# Patient Record
Sex: Male | Born: 1985 | Race: Black or African American | Hispanic: No | Marital: Single | Smoking: Current every day smoker
Health system: Southern US, Community
[De-identification: ages and names within clinical notes are randomized; demographics above are authoritative.]

---

## 2013-06-24 ENCOUNTER — Emergency Department (HOSPITAL_COMMUNITY)
Admission: EM | Admit: 2013-06-24 | Discharge: 2013-06-24 | Disposition: A | Discharge status: Home / Self Care | Payer: Self-pay | Attending: Emergency Medicine | Admitting: Emergency Medicine

## 2013-06-24 ENCOUNTER — Emergency Department (HOSPITAL_COMMUNITY): Payer: Self-pay

## 2013-06-24 ENCOUNTER — Encounter (HOSPITAL_COMMUNITY): Payer: Self-pay | Admitting: Emergency Medicine

## 2013-06-24 DIAGNOSIS — F17200 Nicotine dependence, unspecified, uncomplicated: Secondary | ICD-10-CM | POA: Insufficient documentation

## 2013-06-24 DIAGNOSIS — J029 Acute pharyngitis, unspecified: Secondary | ICD-10-CM | POA: Insufficient documentation

## 2013-06-24 DIAGNOSIS — B9789 Other viral agents as the cause of diseases classified elsewhere: Secondary | ICD-10-CM | POA: Insufficient documentation

## 2013-06-24 DIAGNOSIS — J3489 Other specified disorders of nose and nasal sinuses: Secondary | ICD-10-CM | POA: Insufficient documentation

## 2013-06-24 DIAGNOSIS — B349 Viral infection, unspecified: Secondary | ICD-10-CM

## 2013-06-24 LAB — RAPID STREP SCREEN (MED CTR MEBANE ONLY): STREPTOCOCCUS, GROUP A SCREEN (DIRECT): NEGATIVE

## 2013-06-24 MED ORDER — GUAIFENESIN 100 MG/5ML PO LIQD: 100.0000 mg | ORAL | Status: DC | PRN

## 2013-06-24 MED ORDER — ACETAMINOPHEN 325 MG PO TABS
650.0000 mg | ORAL_TABLET | Freq: Four times a day (QID) | ORAL | Status: DC | PRN
  Administered 2013-06-24: 650 mg via ORAL
  Filled 2013-06-24: qty 2

## 2013-06-24 MED ORDER — ALBUTEROL SULFATE HFA 108 (90 BASE) MCG/ACT IN AERS
2.0000 | INHALATION_SPRAY | Freq: Four times a day (QID) | RESPIRATORY_TRACT | Status: DC | PRN
  Administered 2013-06-24: 2 via RESPIRATORY_TRACT
  Filled 2013-06-24: qty 6.7

## 2013-06-24 MED ORDER — CYCLOBENZAPRINE HCL 10 MG PO TABS: 10.0000 mg | ORAL_TABLET | Freq: Two times a day (BID) | ORAL | Status: DC | PRN

## 2013-06-24 NOTE — ED Provider Notes (Signed)
CSN: [id number]     Arrival date & time 06/24/13  1507 History  This chart was scribed for non-physician practitioner, Fuller Canada, working with Hurman Horn, MD by Smiley Houseman, ED Scribe. This patient was seen in room TR05C/TR05C and the patient's care was started at 6:20 PM.  Chief Complaint  Patient presents with  . Fever  . Sore Throat  . Nasal Congestion    The history is provided by the patient. No language interpreter was used.   HPI Comments: Brandon Reese is a 28 y.o. male who presents to the Emergency Department complaining of constant worsening nasal congestion with associated sore throat and non productive cough that started about 3 days ago.  Pt states his congestion is productive of bloody mucous.  He reports he has had a subjective fever and chills.  ED temperature is currently 102.53F.  Pt reports he took a [facility] yesterday, which provided relief for about 2 hours.  He denies any long travels or air plane travels.  He also denies any h/o of blood clots.  Pt reports he smokes cigarettes.  He states he last surgery was about 7 months ago on his jaw.    History reviewed. No pertinent past medical history. History reviewed. No pertinent past surgical history. No family history on file. History  Substance Use Topics  . Smoking status: Current Every Day Smoker  . Smokeless tobacco: Not on file  . Alcohol Use: Yes     Comment: occ    Review of Systems  Constitutional: Positive for fever and chills.  HENT: Positive for congestion and sore throat. Negative for ear pain.   Respiratory: Positive for cough.   Cardiovascular: Negative for chest pain.  Gastrointestinal: Negative for nausea, vomiting, diarrhea and anal bleeding.  Psychiatric/Behavioral: Negative for behavioral problems and confusion.  All other systems reviewed and are negative.    Allergies  Review of patient's allergies indicates no known allergies.  Home Medications  No current outpatient  prescriptions on file.  Triage Vitals: BP 129/78  Pulse 90  Temp(Src) 102.3 F (39.1 C) (Oral)  Resp 20  Ht 5\' 7"  (1.702 m)  Wt 131 lb 8 oz (59.648 kg)  BMI 20.59 kg/m2  SpO2 96%  Physical Exam  Nursing note and vitals reviewed. Constitutional: He is oriented to person, place, and time. He appears well-developed and well-nourished. No distress.  HENT:  Head: Normocephalic and atraumatic.  Oropharynx mildly erythematous.  No abscess.  Airways intact.    Eyes: EOM are normal.  Neck: Neck supple. No tracheal deviation present.  No meningismus, patient is able to move the neck through all ranges of motion without difficulty  Cardiovascular: Normal rate, regular rhythm and normal heart sounds.  Exam reveals no gallop and no friction rub.   No murmur heard. Pulmonary/Chest: Effort normal and breath sounds normal. No respiratory distress. He has no wheezes. He has no rales. He exhibits no tenderness.  Abdominal: Soft. He exhibits no distension.  Musculoskeletal: Normal range of motion.  Upper trapezius is tender to palpation  Neurological: He is alert and oriented to person, place, and time.  Skin: Skin is warm and dry. No rash noted.  Psychiatric: He has a normal mood and affect. His behavior is normal. Judgment and thought content normal.    ED Course  Procedures (including critical care time) DIAGNOSTIC STUDIES: Oxygen Saturation is 96% on RA, adequate by my interpretation.    COORDINATION OF CARE: 6:25 PM-Discussed with pt his strep test was negative.  Will order chest x-ray.  Will order Tylenol.  Patient informed of current plan of treatment and evaluation and agrees with plan.   Results for orders placed during the hospital encounter of 06/24/13  RAPID STREP SCREEN      Result Value Ref Range   Streptococcus, Group A Screen (Direct) NEGATIVE  NEGATIVE   Dg Chest 2 View  06/24/2013   CLINICAL DATA:  Fever.  Cough.  Chest congestion and body aches.  EXAM: CHEST  2 VIEW   COMPARISON:  None.  FINDINGS: The heart size and mediastinal contours are within normal limits. Both lungs are clear. The visualized skeletal structures are unremarkable.  IMPRESSION: No active cardiopulmonary disease.   Electronically Signed   By: Myles RosenthalJohn  Stahl M.D.   On: 06/24/2013 19:33      MDM   Final diagnoses:  Viral syndrome   Patient with viral syndrome. Afebrile, but improved with Tylenol. Chest x-ray and strep tests are negative. At discharge, the patient asked for something for his neck stiffness. No headaches, no meningeal signs, no evidence of meningitis. I discussed patient with Dr. Lestine BoxBednarz, and agrees with the patient can be discharged to home. Patient given strict return precautions. Patient is stable and ready for discharge.  I personally performed the services described in this documentation, which was scribed in my presence. The recorded information has been reviewed and is accurate.     Roxy Horsemanobert Jyron Turman, PA-C 06/24/13 2008

## 2013-06-24 NOTE — Discharge Instructions (Signed)
Viral Infections °A virus is a type of germ. Viruses can cause: °· Minor sore throats. °· Aches and pains. °· Headaches. °· Runny nose. °· Rashes. °· Watery eyes. °· Tiredness. °· Coughs. °· Loss of appetite. °· Feeling sick to your stomach (nausea). °· Throwing up (vomiting). °· Watery poop (diarrhea). °HOME CARE  °· Only take medicines as told by your doctor. °· Drink enough water and fluids to keep your pee (urine) clear or pale yellow. Sports drinks are a good choice. °· Get plenty of rest and eat healthy. Soups and broths with crackers or rice are fine. °GET HELP RIGHT AWAY IF:  °· You have a very bad headache. °· You have shortness of breath. °· You have chest pain or neck pain. °· You have an unusual rash. °· You cannot stop throwing up. °· You have watery poop that does not stop. °· You cannot keep fluids down. °· You or your child has a temperature by mouth above 102° F (38.9° C), not controlled by medicine. °· Your baby is older than 3 months with a rectal temperature of 102° F (38.9° C) or higher. °· Your baby is 3 months old or younger with a rectal temperature of 100.4° F (38° C) or higher. °MAKE SURE YOU:  °· Understand these instructions. °· Will watch this condition. °· Will get help right away if you are not doing well or get worse. °Document Released: 04/02/2008 Document Revised: 07/13/2011 Document Reviewed: 08/26/2010 °Exit[employer]® Patient Information ©2014 [employer]. ° °

## 2013-06-24 NOTE — ED Notes (Signed)
Pt here with fever, sore throat, and congestion. Pt last had BC powder at 0400 today.

## 2013-06-25 NOTE — ED Provider Notes (Signed)
Medical screening examination/treatment/procedure(s) were performed by non-physician practitioner and as supervising physician I was immediately available for consultation/collaboration.   Giles Currie M Noha Karasik, MD 06/25/13 0256 

## 2013-06-27 LAB — CULTURE, GROUP A STREP

## 2014-10-25 IMAGING — CR DG CHEST 2V
2 series · 2 of 2 positions shown · non-contrast
Comparison: None.

CLINICAL DATA: Fever.  Cough.  Chest congestion and body aches.

EXAM:
CHEST  2 VIEW

[w chest pa]
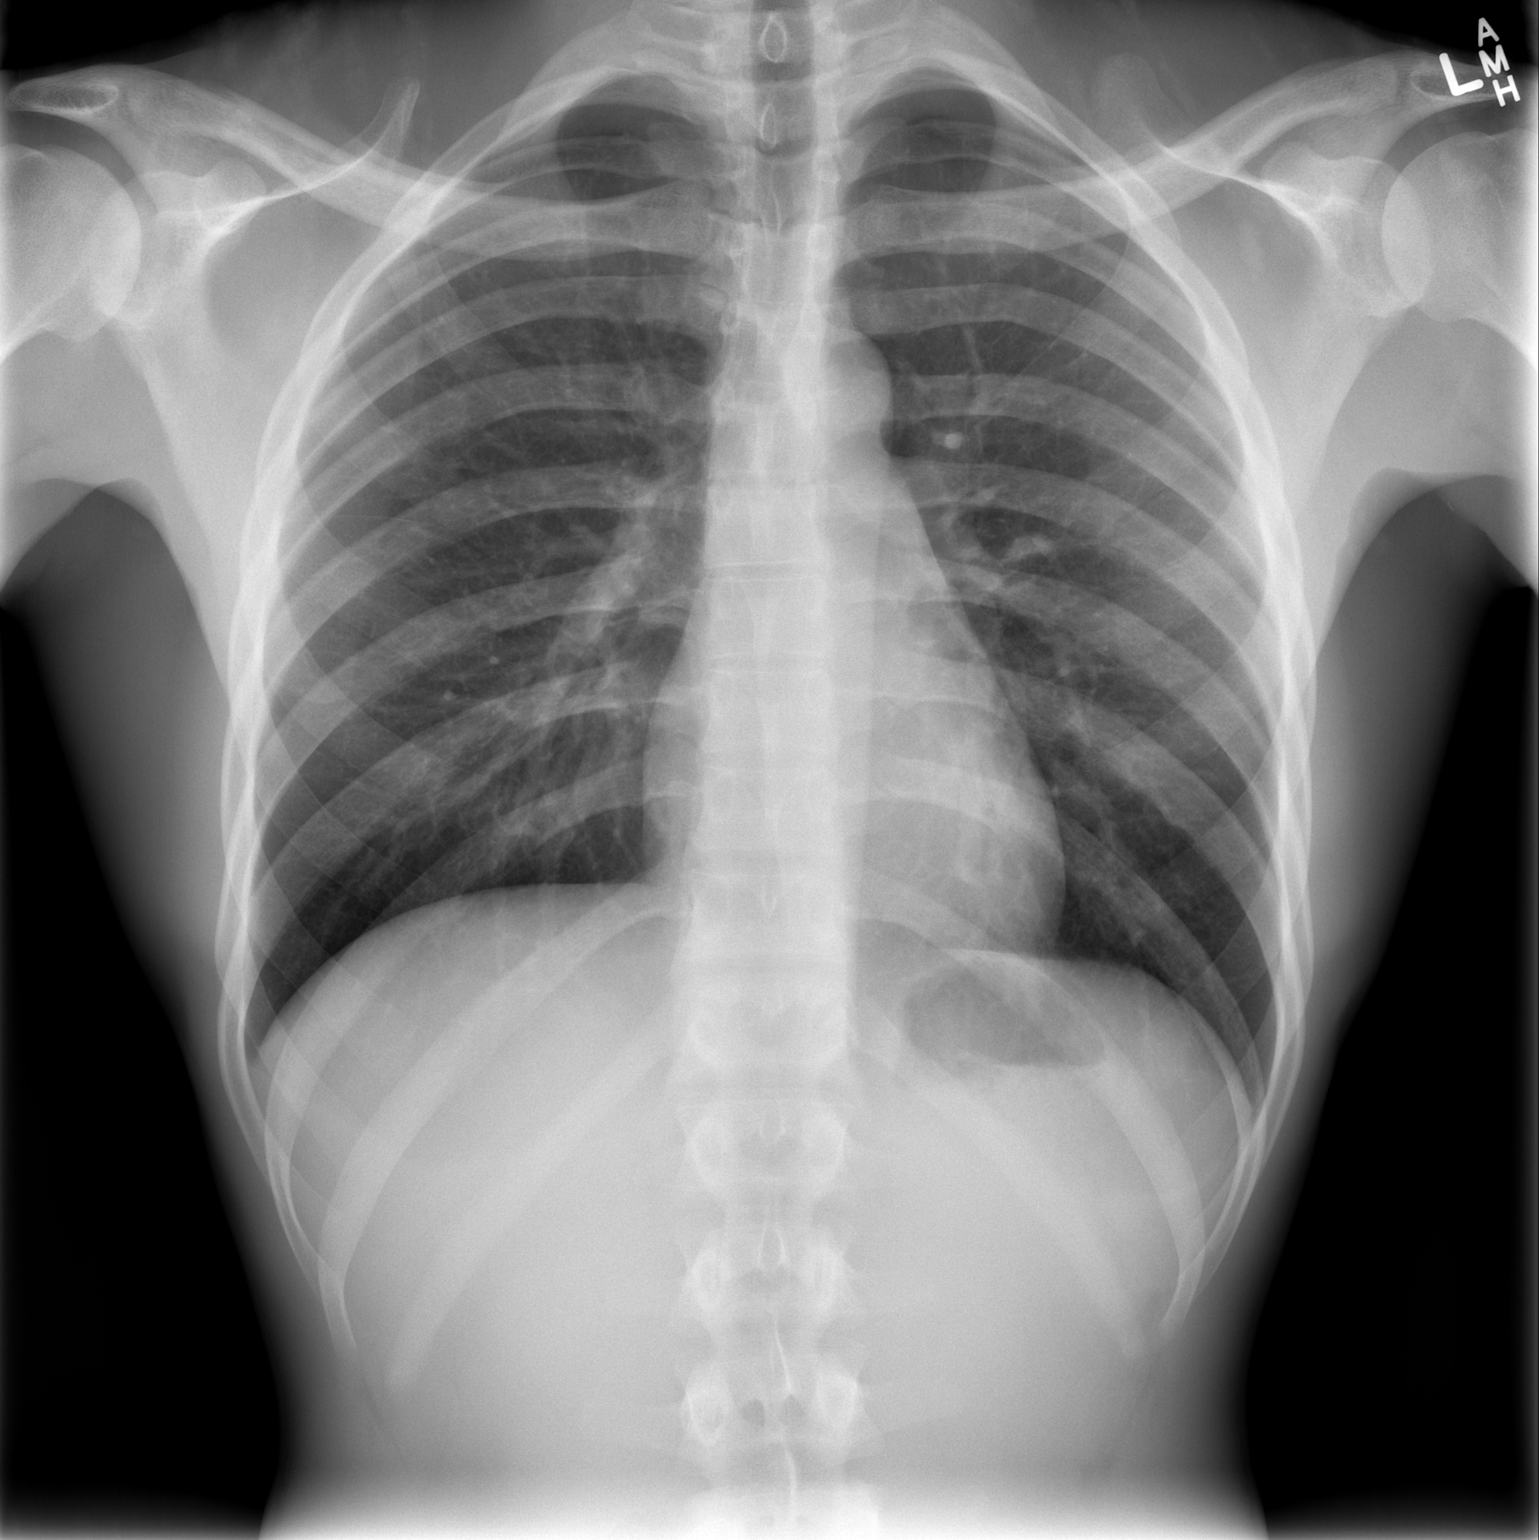

[w chest lat]
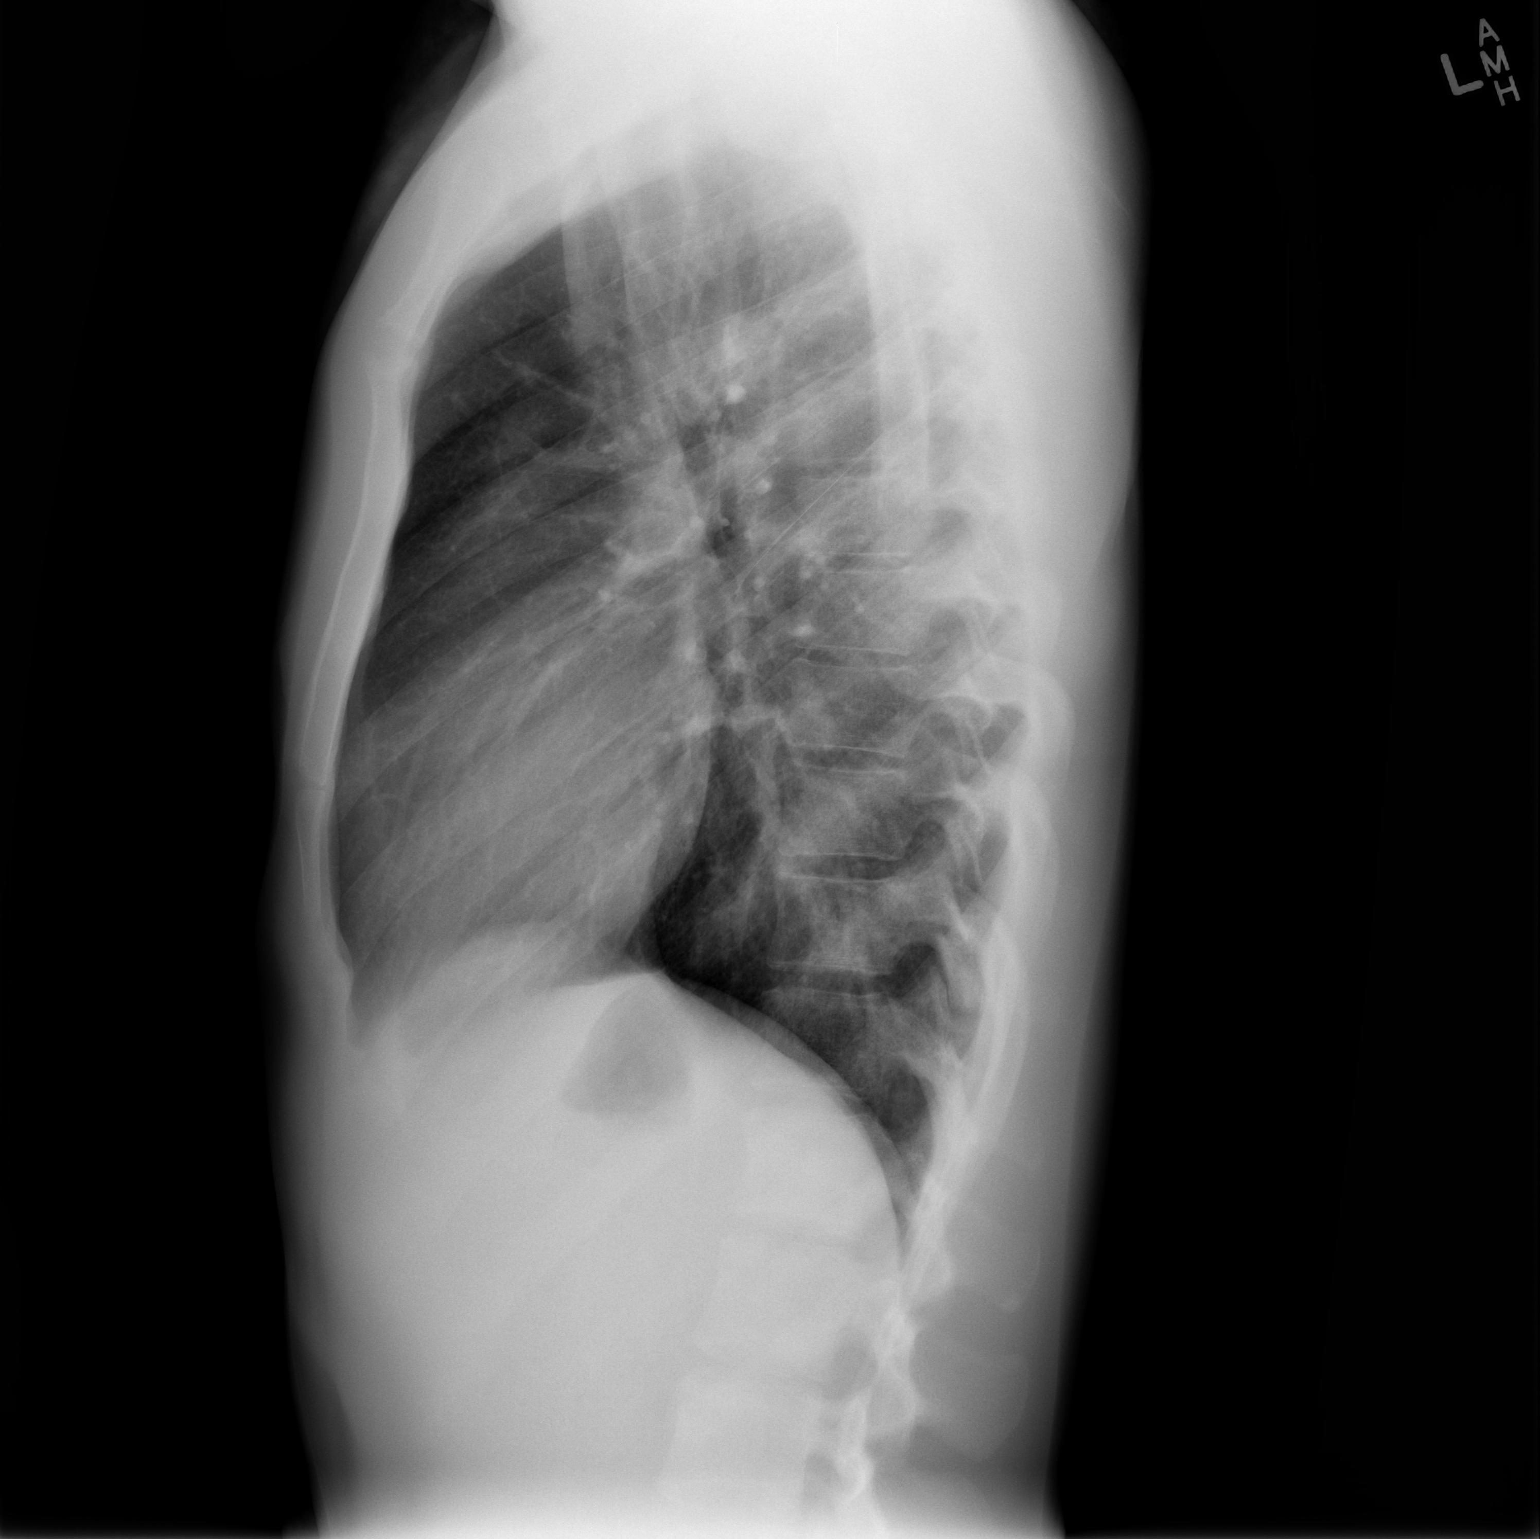

[2 of 2 positions shown; findings below may reference images not displayed]

FINDINGS: The heart size and mediastinal contours are within normal limits.
Both lungs are clear. The visualized skeletal structures are
unremarkable.
IMPRESSION: No active cardiopulmonary disease.

## 2015-06-01 ENCOUNTER — Encounter (HOSPITAL_COMMUNITY): Payer: Self-pay | Admitting: Nurse Practitioner

## 2015-06-01 ENCOUNTER — Emergency Department (HOSPITAL_COMMUNITY)
Admission: EM | Admit: 2015-06-01 | Discharge: 2015-06-01 | Disposition: A | Discharge status: Home / Self Care | Payer: Self-pay | Attending: Emergency Medicine | Admitting: Emergency Medicine

## 2015-06-01 DIAGNOSIS — F10120 Alcohol abuse with intoxication, uncomplicated: Secondary | ICD-10-CM

## 2015-06-01 DIAGNOSIS — F17200 Nicotine dependence, unspecified, uncomplicated: Secondary | ICD-10-CM | POA: Insufficient documentation

## 2015-06-01 DIAGNOSIS — F1010 Alcohol abuse, uncomplicated: Secondary | ICD-10-CM | POA: Insufficient documentation

## 2015-06-01 LAB — CBC WITH DIFFERENTIAL/PLATELET
Basophils Absolute: 0 10*3/uL (ref 0.0–0.1)
Basophils Relative: 0 %
Eosinophils Absolute: 0 10*3/uL (ref 0.0–0.7)
Eosinophils Relative: 0 %
HCT: 43.9 % (ref 39.0–52.0)
Hemoglobin: 15 g/dL (ref 13.0–17.0)
Lymphocytes Relative: 10 %
Lymphs Abs: 1.1 10*3/uL (ref 0.7–4.0)
MCH: 33.6 pg (ref 26.0–34.0)
MCHC: 34.2 g/dL (ref 30.0–36.0)
MCV: 98.4 fL (ref 78.0–100.0)
Monocytes Absolute: 0.7 10*3/uL (ref 0.1–1.0)
Monocytes Relative: 7 %
Neutro Abs: 8.4 10*3/uL — ABNORMAL HIGH (ref 1.7–7.7)
Neutrophils Relative %: 83 %
Platelets: 187 10*3/uL (ref 150–400)
RBC: 4.46 MIL/uL (ref 4.22–5.81)
RDW: 11.5 % (ref 11.5–15.5)
WBC: 10.2 10*3/uL (ref 4.0–10.5)

## 2015-06-01 LAB — COMPREHENSIVE METABOLIC PANEL
ALT: 28 U/L (ref 17–63)
AST: 40 U/L (ref 15–41)
Albumin: 5.1 g/dL — ABNORMAL HIGH (ref 3.5–5.0)
Alkaline Phosphatase: 91 U/L (ref 38–126)
Anion gap: 15 (ref 5–15)
BUN: 16 mg/dL (ref 6–20)
CO2: 23 mmol/L (ref 22–32)
Calcium: 9.9 mg/dL (ref 8.9–10.3)
Chloride: 97 mmol/L — ABNORMAL LOW (ref 101–111)
Creatinine, Ser: 1.12 mg/dL (ref 0.61–1.24)
GFR calc Af Amer: >60 mL/min (ref >60)
GFR calc non Af Amer: >60 mL/min (ref >60)
Glucose, Bld: 283 mg/dL — ABNORMAL HIGH (ref 65–99)
Potassium: 4 mmol/L (ref 3.5–5.1)
Sodium: 135 mmol/L (ref 135–145)
Total Bilirubin: 1.8 mg/dL — ABNORMAL HIGH (ref 0.3–1.2)
Total Protein: 8.9 g/dL — ABNORMAL HIGH (ref 6.5–8.1)

## 2015-06-01 MED ORDER — ONDANSETRON HCL 4 MG/2ML IJ SOLN
4.0000 mg | Freq: Once | INTRAMUSCULAR | Status: AC
  Administered 2015-06-01: 4 mg via INTRAVENOUS
  Filled 2015-06-01: qty 2

## 2015-06-01 MED ORDER — SUCRALFATE 1 GM/10ML PO SUSP
1.0000 g | Freq: Once | ORAL | Status: AC
  Administered 2015-06-01: 1 g via ORAL
  Filled 2015-06-01: qty 10

## 2015-06-01 MED ORDER — SODIUM CHLORIDE 0.9 % IV BOLUS (SEPSIS)
2000.0000 mL | Freq: Once | INTRAVENOUS | Status: AC
  Administered 2015-06-01: 2000 mL via INTRAVENOUS

## 2015-06-01 MED ORDER — PROMETHAZINE HCL 25 MG PO TABS: 25.0000 mg | ORAL_TABLET | Freq: Three times a day (TID) | ORAL | Status: DC | PRN

## 2015-06-01 MED ORDER — PROMETHAZINE HCL 25 MG/ML IJ SOLN
25.0000 mg | Freq: Once | INTRAMUSCULAR | Status: DC
  Filled 2015-06-01: qty 1

## 2015-06-01 MED ORDER — PANTOPRAZOLE SODIUM 20 MG PO TBEC
20.0000 mg | DELAYED_RELEASE_TABLET | Freq: Every day | ORAL | Status: DC
  Administered 2015-06-01: 20 mg via ORAL
  Filled 2015-06-01: qty 1

## 2015-06-01 NOTE — ED Notes (Signed)
Pt states "I feel a lot better.  I think I just need to eat."

## 2015-06-01 NOTE — Discharge Instructions (Signed)
Return here as needed.  Follow-up with a primary care doctor.  Your testing here today was normal other than your blood sugar being elevated.  This will need to be rechecked

## 2015-06-01 NOTE — ED Notes (Signed)
Pt states last night he drank excessively, 1/2 a bottle of 40oz 100 proof liquor, woke with severe muscle weakness, cramping vomiting and "feeling sick." Reports throat burning sensation. Denies co-ingestions, denies being an alcoholic.

## 2015-06-03 NOTE — ED Provider Notes (Signed)
CSN: [id number]     Arrival date & time 06/01/15  1946 History   First MD Initiated Contact with Patient 06/01/15 2010     Chief Complaint  Patient presents with  . Emesis  . Fatigue     (Consider location/radiation/quality/duration/timing/severity/associated sxs/prior Treatment) HPI Patient presents to the emergency department with nausea, vomiting after drinking heavy amounts of alcohol last night.  The patient states that he went out drinking and had half a bottle of liquor and states she took a couple shots. Feels dizzy with cramping and vomiting.  The patient states that he has acid reflux as well.  Patient did not take any medications prior to arrival.  Patient denies chest pain, shortness of breath, weakness, numbness, dizziness, back pain, neck pain, fever, dysuria, incontinence, but stool, hematemesis, diarrhea, or syncope History reviewed. No pertinent past medical history. History reviewed. No pertinent past surgical history. History reviewed. No pertinent family history. Social History  Substance Use Topics  . Smoking status: Current Every Day Smoker  . Smokeless tobacco: None  . Alcohol Use: Yes     Comment: occ    Review of Systems  All other systems negative except as documented in the HPI. All pertinent positives and negatives as reviewed in the HPI.  Allergies  Review of patient's allergies indicates no known allergies.  Home Medications   Prior to Admission medications   Medication Sig Start Date End Date Taking? Authorizing Provider  cyclobenzaprine (FLEXERIL) 10 MG tablet Take 1 tablet (10 mg total) by mouth 2 (two) times daily as needed for muscle spasms. Patient not taking: Reported on 06/01/2015 06/24/13   Roxy Horseman, PA-C  guaiFENesin (ROBITUSSIN) 100 MG/5ML liquid Take 5-10 mLs (100-200 mg total) by mouth every 4 (four) hours as needed for cough. Patient not taking: Reported on 06/01/2015 06/24/13   Roxy Horseman, PA-C  promethazine (PHENERGAN) 25  MG tablet Take 1 tablet (25 mg total) by mouth every 8 (eight) hours as needed for nausea or vomiting. 06/01/15   Charlestine Night, PA-C   BP 133/87 mmHg  Pulse 90  Temp(Src) 98.2 F (36.8 C) (Oral)  Resp 18  SpO2 99% Physical Exam  Constitutional: He is oriented to person, place, and time. He appears well-developed and well-nourished. No distress.  HENT:  Head: Normocephalic and atraumatic.  Mouth/Throat: Oropharynx is clear and moist.  Eyes: Pupils are equal, round, and reactive to light.  Neck: Normal range of motion. Neck supple.  Cardiovascular: Normal rate, regular rhythm and normal heart sounds.  Exam reveals no gallop and no friction rub.   No murmur heard. Pulmonary/Chest: Effort normal and breath sounds normal. No respiratory distress. He has no wheezes.  Abdominal: Soft. Bowel sounds are normal. He exhibits no distension. There is no tenderness.  Neurological: He is alert and oriented to person, place, and time. He exhibits normal muscle tone. Coordination normal.  Skin: Skin is warm and dry. No rash noted. No erythema.  Psychiatric: He has a normal mood and affect. His behavior is normal.  Nursing note and vitals reviewed.   ED Course  Procedures (including critical care time) Labs Review Labs Reviewed  COMPREHENSIVE METABOLIC PANEL - Abnormal; Notable for the following:    Chloride 97 (*)    Glucose, Bld 283 (*)    Total Protein 8.9 (*)    Albumin 5.1 (*)    Total Bilirubin 1.8 (*)    All other components within normal limits  CBC WITH DIFFERENTIAL/PLATELET - Abnormal; Notable for the following:  Neutro Abs 8.4 (*)    All other components within normal limits    Imaging Review No results found. I have personally reviewed and evaluated these images and lab results as part of my medical decision-making.   EKG Interpretation None      MDM   Final diagnoses:  Hangover effect, uncomplicated (HCC)    Patient will be given IV fluids, antiemetics.  Told  to return here as needed.  Patient agrees the plan and all questions were answered.  Patient is feeling better following IV fluids and antiemetics    Charlestine Night, PA-C 06/03/15 0106  Richardean Canal, MD 06/04/15 1122

## 2019-11-22 ENCOUNTER — Other Ambulatory Visit: Payer: Self-pay | Admitting: *Deleted

## 2019-11-22 ENCOUNTER — Other Ambulatory Visit: Payer: Self-pay

## 2019-11-22 ENCOUNTER — Ambulatory Visit: Payer: Self-pay | Attending: Internal Medicine

## 2019-11-22 DIAGNOSIS — Z20822 Contact with and (suspected) exposure to covid-19: Secondary | ICD-10-CM

## 2019-11-23 LAB — SARS-COV-2, NAA 2 DAY TAT

## 2019-11-23 LAB — NOVEL CORONAVIRUS, NAA: SARS-CoV-2, NAA: NOT DETECTED

## 2019-12-19 ENCOUNTER — Other Ambulatory Visit: Payer: Self-pay

## 2019-12-19 DIAGNOSIS — Z20822 Contact with and (suspected) exposure to covid-19: Secondary | ICD-10-CM

## 2019-12-20 LAB — SARS-COV-2, NAA 2 DAY TAT

## 2019-12-20 LAB — NOVEL CORONAVIRUS, NAA: SARS-CoV-2, NAA: NOT DETECTED

## 2020-01-23 ENCOUNTER — Other Ambulatory Visit: Payer: Self-pay

## 2020-01-23 DIAGNOSIS — Z20822 Contact with and (suspected) exposure to covid-19: Secondary | ICD-10-CM

## 2020-01-25 LAB — NOVEL CORONAVIRUS, NAA: SARS-CoV-2, NAA: NOT DETECTED

## 2020-01-25 LAB — SARS-COV-2, NAA 2 DAY TAT

## 2022-07-06 ENCOUNTER — Emergency Department (HOSPITAL_COMMUNITY): Payer: Self-pay

## 2022-07-06 ENCOUNTER — Emergency Department (HOSPITAL_COMMUNITY)
Admission: EM | Admit: 2022-07-06 | Discharge: 2022-07-06 | Disposition: A | Discharge status: Home / Self Care | Payer: Self-pay | Attending: Emergency Medicine | Admitting: Emergency Medicine

## 2022-07-06 ENCOUNTER — Encounter (HOSPITAL_COMMUNITY): Payer: Self-pay | Admitting: Emergency Medicine

## 2022-07-06 DIAGNOSIS — J020 Streptococcal pharyngitis: Secondary | ICD-10-CM | POA: Insufficient documentation

## 2022-07-06 DIAGNOSIS — Z1152 Encounter for screening for COVID-19: Secondary | ICD-10-CM | POA: Insufficient documentation

## 2022-07-06 LAB — RESP PANEL BY RT-PCR (RSV, FLU A&B, COVID)  RVPGX2
Influenza A by PCR: NEGATIVE
Influenza B by PCR: NEGATIVE
Resp Syncytial Virus by PCR: NEGATIVE
SARS Coronavirus 2 by RT PCR: NEGATIVE

## 2022-07-06 LAB — GROUP A STREP BY PCR: Group A Strep by PCR: DETECTED — AB

## 2022-07-06 MED ORDER — METOCLOPRAMIDE HCL 5 MG/ML IJ SOLN
10.0000 mg | Freq: Once | INTRAMUSCULAR | Status: AC
  Administered 2022-07-06: 10 mg via INTRAMUSCULAR
  Filled 2022-07-06: qty 2

## 2022-07-06 MED ORDER — KETOROLAC TROMETHAMINE 15 MG/ML IJ SOLN
15.0000 mg | Freq: Once | INTRAMUSCULAR | Status: AC
  Administered 2022-07-06: 15 mg via INTRAMUSCULAR
  Filled 2022-07-06: qty 1

## 2022-07-06 MED ORDER — DEXAMETHASONE SODIUM PHOSPHATE 10 MG/ML IJ SOLN
10.0000 mg | Freq: Once | INTRAMUSCULAR | Status: AC
  Administered 2022-07-06: 10 mg via INTRAMUSCULAR
  Filled 2022-07-06: qty 1

## 2022-07-06 MED ORDER — PENICILLIN V POTASSIUM 500 MG PO TABS: 500.0000 mg | ORAL_TABLET | Freq: Two times a day (BID) | ORAL | 0 refills | Status: AC

## 2022-07-06 NOTE — ED Provider Notes (Signed)
[facility] Provider Note   CSN: [id number] Arrival date & time: 07/06/22  0920     History  Chief Complaint  Patient presents with   Sore Throat    Brandon Reese is a 37 y.o. male with no past medical history presents to the ED complaining of sore throat, subjective fever, chills, and generalized headache for the past 2 weeks.  Patient reports positive sick contacts but believes may have influenza.  He states that at onset of symptoms he had associated cough, congestion, and bodyaches but these have since resolved, however, he has had persistent sore throat and headache that will not go away with use of over-the-counter medications.  He denies chest pain, shortness of breath, difficulty swallowing, abdominal pain, nausea, vomiting, or diarrhea.      Home Medications Prior to Admission medications   Medication Sig Start Date End Date Taking? Authorizing Provider  penicillin v potassium (VEETID) 500 MG tablet Take 1 tablet (500 mg total) by mouth in the morning and at bedtime for 10 days. 07/06/22 07/16/22 Yes Darshana Curnutt L, PA-C  cyclobenzaprine (FLEXERIL) 10 MG tablet Take 1 tablet (10 mg total) by mouth 2 (two) times daily as needed for muscle spasms. Patient not taking: Reported on 06/01/2015 06/24/13   Montine Circle, PA-C  guaiFENesin (ROBITUSSIN) 100 MG/5ML liquid Take 5-10 mLs (100-200 mg total) by mouth every 4 (four) hours as needed for cough. Patient not taking: Reported on 06/01/2015 06/24/13   Montine Circle, PA-C  promethazine (PHENERGAN) 25 MG tablet Take 1 tablet (25 mg total) by mouth every 8 (eight) hours as needed for nausea or vomiting. 06/01/15   Dalia Heading, PA-C      Allergies    Patient has no known allergies.    Review of Systems   Review of Systems  All other systems reviewed and are negative.   Physical Exam Updated Vital Signs BP (!) 136/94   Pulse 94   Temp 100.1 F (37.8 C)   Resp 18    SpO2 97%  Physical Exam Vitals and nursing note reviewed.  Constitutional:      General: He is not in acute distress.    Appearance: Normal appearance. He is not ill-appearing, toxic-appearing or diaphoretic.  HENT:     Head: Normocephalic and atraumatic.     Nose: No congestion or rhinorrhea.     Mouth/Throat:     Mouth: Mucous membranes are moist. No oral lesions.     Pharynx: Uvula midline. Pharyngeal swelling (minimal to right posterior pharynx) and posterior oropharyngeal erythema (moderate to posterior pharynx) present. No uvula swelling.     Tonsils: Tonsillar exudate (white to bilateral tonsils worse on right) present. No tonsillar abscesses. 1+ on the right. 1+ on the left.     Comments: No submental, submandibular, or sublingual tenderness, induration, or fluctuance Eyes:     Conjunctiva/sclera: Conjunctivae normal.  Cardiovascular:     Rate and Rhythm: Normal rate and regular rhythm.     Heart sounds: No murmur heard. Pulmonary:     Effort: Pulmonary effort is normal. No respiratory distress.     Breath sounds: Normal breath sounds. No stridor. No wheezing, rhonchi or rales.  Chest:     Chest wall: No tenderness.  Abdominal:     General: Abdomen is flat.     Palpations: Abdomen is soft.     Tenderness: There is no abdominal tenderness. There is no guarding or rebound.  Musculoskeletal:  General: Normal range of motion.     Cervical back: Normal range of motion and neck supple.     Right lower leg: No edema.     Left lower leg: No edema.  Lymphadenopathy:     Cervical: Cervical adenopathy (mildly swollen and tender right anterior cervical nodes) present.  Skin:    General: Skin is warm and dry.     Capillary Refill: Capillary refill takes less than 2 seconds.  Neurological:     General: No focal deficit present.     Mental Status: He is alert. Mental status is at baseline.  Psychiatric:        Mood and Affect: Mood normal.        Behavior: Behavior normal.      ED Results / Procedures / Treatments   Labs (all labs ordered are listed, but only abnormal results are displayed) Labs Reviewed  GROUP A STREP BY PCR - Abnormal; Notable for the following components:      Result Value   Group A Strep by PCR DETECTED (*)    All other components within normal limits  RESP PANEL BY RT-PCR (RSV, FLU A&B, COVID)  [id number]    EKG None  Radiology No results found.  Procedures Procedures    Medications Ordered in ED Medications  metoCLOPramide (REGLAN) injection 10 mg (10 mg Intramuscular Given 07/06/22 1100)  ketorolac (TORADOL) 15 MG/ML injection 15 mg (15 mg Intramuscular Given 07/06/22 1103)  dexamethasone (DECADRON) injection 10 mg (10 mg Intramuscular Given 07/06/22 1102)    ED Course/ Medical Decision Making/ A&P                             Medical Decision Making Amount and/or Complexity of Data Reviewed Labs: ordered. Decision-making details documented in ED Course.  Risk Prescription drug management.   Medical Decision Making:   Brandon Reese is a 37 y.o. male who presented to the ED today with sore throat detailed above.    Patient's presentation is complicated by their history of recent viral illness.  Complete initial physical exam performed, notably the patient was in no acute distress, lungs are clear to auscultation, patient did have mild posterior pharyngeal swelling but airway was widely patent.  Posterior pharynx was erythematous and there was exudate to bilateral tonsils worse on the right.  Patient neurologically intact.  No meningismus. Reviewed and confirmed nursing documentation for past medical history, family history, social history.    Initial Assessment:   With the patient's presentation of sore throat, most likely diagnosis is strep pharyngitis. Differential diagnosis includes but is not limited to viral pharyngitis, COVID-19, influenza, RSV, pneumonia, meningitis, Ludwigs angina.  This is most consistent with  an acute complicated illness  Initial Plan:  Strep swab Viral swabs Headache treatment Objective evaluation as reviewed   Initial Study Results:    Final Assessment and Plan:   This is a 37 year old male presenting to the ED complaining of sore throat and headache.  Patient with other recent upper respiratory symptoms that have mostly resolved.  Positive sick contacts with influenza.  On exam, patient has pharyngeal erythema and mild swelling on the right.  Tonsils are also erythematous with white exudates.  Airway is patent.  Lungs are clear to auscultation.  No signs of respiratory distress.  No signs of peritonsillar or retropharyngeal abscess. No nuchal rigidity or signs of meningismus.  Viral and strep swabs ordered as above.  Patient with stable  vital signs though slightly elevated temperature at 100.1 F.  Normal oxygen saturation on room air, no tachypnea, no tachycardia.  Strep swab was positive.  Patient reports good headache relief with medications as given above.  Discussed option with patient for strep treatment with IM antibiotics today or p.o. antibiotics for home.  Patient reports he would like to proceed with the p.o. antibiotic course.  Prescription provided for antibiotics.  Patient given strict ED return precautions, all questions answered, patient stable for discharge.   Clinical Impression:  1. Strep pharyngitis      Discharge         Final Clinical Impression(s) / ED Diagnoses Final diagnoses:  Strep pharyngitis    Rx / DC Orders ED Discharge Orders          Ordered    penicillin v potassium (VEETID) 500 MG tablet  2 times daily        07/06/22 1319              Suzzette Righter, PA-C 07/06/22 1649    Pattricia Boss, MD 07/08/22 1450

## 2022-07-06 NOTE — ED Triage Notes (Signed)
Pt reports flu like symptoms for week that went away and sore throat and headache that is keeping him up at night. Concerned he has strep.

## 2022-07-06 NOTE — Discharge Instructions (Addendum)
Thank you for letting us take care of you today.  Your strep test was positive.  We gave you medications while in the emergency department to treat your symptoms.  I am sending you home with antibiotics to treat strep which is a bacterial infection.  Please take all of these antibiotics as prescribed.  I recommend that you follow-up with primary care in the next week if you continue to have symptoms.  If you do not have a primary care provider, I provided the names of 2 clinics that you may contact to arrange a follow-up appointment or you may go to a PCP of your own choosing.  If you develop any worsening symptoms such as difficulty breathing, inability to swallow your saliva, food, or drinks, chest pain, or any other new or concerning symptoms, please be reevaluated in the nearest emergency department.

## 2023-08-04 ENCOUNTER — Ambulatory Visit (INDEPENDENT_AMBULATORY_CARE_PROVIDER_SITE_OTHER): Admitting: Podiatry

## 2023-08-04 ENCOUNTER — Encounter: Payer: Self-pay | Admitting: Podiatry

## 2023-08-04 DIAGNOSIS — B351 Tinea unguium: Secondary | ICD-10-CM

## 2023-08-04 DIAGNOSIS — Z79899 Other long term (current) drug therapy: Secondary | ICD-10-CM

## 2023-08-04 NOTE — Progress Notes (Unsigned)
   Chief Complaint  Patient presents with   Nail Problem    new pt-discoloration and thickening of bilateral toenails 2nd and 3rd,as well as hallux toenails (are worse)    Subjective: 38 y.o. male presenting today as a new patient for evaluation of thick discoloration to the toenails bilateral.  Onset for several years.  He has not been anything for treatment.  He is concern for toenail fungus.  History reviewed. No pertinent past medical history.  History reviewed. No pertinent surgical history.  No Known Allergies   08/04/2023  Objective: Physical Exam General: The patient is alert and oriented x3 in no acute distress.  Dermatology: Hyperkeratotic, discolored, thickened, onychodystrophy noted. Skin is warm, dry and supple bilateral lower extremities. Negative for open lesions or macerations.  Vascular: Palpable pedal pulses bilaterally. No edema or erythema noted. Capillary refill within normal limits.  Neurological: Grossly intact via light touch  Musculoskeletal Exam: No pedal deformity noted  Assessment: #1 Onychomycosis of toenails bilateral  Plan of Care:  -Patient was evaluated. -Today we discussed different treatment options including oral, topical, and laser antifungal treatment modalities.  We discussed their efficacies and side effects.  Patient opts for oral antifungal treatment modality -Unfortunately patient admits to heavy use of alcohol.  He had recent blood work which demonstrated elevated LFT levels.  No prescription for oral Lamisil -Recommend OTC topical antifungal -Return to clinic as needed   Felecia Shelling, DPM [facility]  Dr. Felecia Shelling, DPM    [address] [place], [place] [address]                Office [phone]  Fax [phone]

## 2024-01-30 ENCOUNTER — Telehealth: Payer: No Typology Code available for payment source | Admitting: Family

## 2024-01-30 DIAGNOSIS — J069 Acute upper respiratory infection, unspecified: Secondary | ICD-10-CM

## 2024-01-30 MED ORDER — FLUTICASONE PROPIONATE 50 MCG/ACT NA SUSP: 2.0000 | Freq: Every day | NASAL | 6 refills | Status: AC

## 2024-01-30 MED ORDER — CETIRIZINE HCL 10 MG PO TABS: 10.0000 mg | ORAL_TABLET | Freq: Every day | ORAL | 1 refills | Status: AC

## 2024-01-30 NOTE — Progress Notes (Signed)
 Virtual Visit Consent   Brandon Reese, you are scheduled for a virtual visit with a [facility] provider today. Just as with appointments in the office, your consent must be obtained to participate. Your consent will be active for this visit and any virtual visit you may have with one of our providers in the next 365 days. If you have a MyChart account, a copy of this consent can be sent to you electronically.  As this is a virtual visit, video technology does not allow for your provider to perform a traditional examination. This may limit your provider's ability to fully assess your condition. If your provider identifies any concerns that need to be evaluated in person or the need to arrange testing (such as labs, EKG, etc.), we will make arrangements to do so. Although advances in technology are sophisticated, we cannot ensure that it will always work on either your end or our end. If the connection with a video visit is poor, the visit may have to be switched to a telephone visit. With either a video or telephone visit, we are not always able to ensure that we have a secure connection.  By engaging in this virtual visit, you consent to the provision of healthcare and authorize for your insurance to be billed (if applicable) for the services provided during this visit. Depending on your insurance coverage, you may receive a charge related to this service.  I need to obtain your verbal consent now. Are you willing to proceed with your visit today? Brandon Reese has provided verbal consent on 01/30/2024 for a virtual visit (video or telephone). Bari Learn, FNP  Date: 01/30/2024 7:34 PM   Virtual Visit via Video Note   I, Bari Learn, connected with  Brandon Reese  ([id number], [birth year]) on 01/30/24 at  7:30 PM EDT by a video-enabled telemedicine application and verified that I am speaking with the correct person using two identifiers.  Location: Patient: Virtual Visit Location  Patient: Home Provider: Virtual Visit Location Provider: Home Office   I discussed the limitations of evaluation and management by telemedicine and the availability of in person appointments. The patient expressed understanding and agreed to proceed.    History of Present Illness: Brandon Reese is a 38 y.o. who identifies as a male who was assigned male at birth, and is being seen today for sore throat that started yesterday. Did a home COVID test that was negative.   HPI: Sore Throat  This is a new problem. The current episode started yesterday. The problem has been unchanged. The maximum temperature recorded prior to his arrival was 101 - 101.9 F. The pain is at a severity of 7/10. The pain is moderate. Associated symptoms include congestion, headaches and trouble swallowing. Pertinent negatives include no coughing, ear pain, shortness of breath or swollen glands. He has tried acetaminophen  and NSAIDs for the symptoms. The treatment provided mild relief.    Problems: There are no active problems to display for this patient.   Allergies: No Known Allergies Medications:  Current Outpatient Medications:    cetirizine (ZYRTEC ALLERGY) 10 MG tablet, Take 1 tablet (10 mg total) by mouth daily., Disp: 90 tablet, Rfl: 1   fluticasone (FLONASE) 50 MCG/ACT nasal spray, Place 2 sprays into both nostrils daily., Disp: 16 g, Rfl: 6  Observations/Objective: Patient is well-developed, well-nourished in no acute distress.  Resting comfortably  at home.  Head is normocephalic, atraumatic.  No labored breathing.  Speech is clear and coherent with logical content.  Patient is alert and oriented at baseline.  Throat erythemas   Assessment and Plan: 1. Upper respiratory tract infection, unspecified type (Primary) - fluticasone (FLONASE) 50 MCG/ACT nasal spray; Place 2 sprays into both nostrils daily.  Dispense: 16 g; Refill: 6 - cetirizine (ZYRTEC ALLERGY) 10 MG tablet; Take 1 tablet (10 mg total) by  mouth daily.  Dispense: 90 tablet; Refill: 1  - Take meds as prescribed - Use a cool mist humidifier  -Use saline nose sprays frequently -Force fluids -For any cough or congestion  Use plain Mucinex - regular strength or max strength is fine -For fever or aces or pains- take tylenol  or ibuprofen. -Throat lozenges if help -New toothbrush in 3 days  Follow up if symptoms worsen or do not improve   Follow Up Instructions: I discussed the assessment and treatment plan with the patient. The patient was provided an opportunity to ask questions and all were answered. The patient agreed with the plan and demonstrated an understanding of the instructions.  A copy of instructions were sent to the patient via MyChart unless otherwise noted below.     The patient was advised to call back or seek an in-person evaluation if the symptoms worsen or if the condition fails to improve as anticipated.    Bari Learn, FNP

## 2024-01-30 NOTE — Patient Instructions (Signed)

## 2024-02-01 ENCOUNTER — Telehealth: Admitting: Family

## 2024-02-01 DIAGNOSIS — J069 Acute upper respiratory infection, unspecified: Secondary | ICD-10-CM | POA: Diagnosis not present

## 2024-02-01 NOTE — Progress Notes (Signed)
 Virtual Visit Consent   [name], you are scheduled for a virtual visit with a [facility] provider today. Just as with appointments in the office, your consent must be obtained to participate. Your consent will be active for this visit and any virtual visit you may have with one of our providers in the next 365 days. If you have a MyChart account, a copy of this consent can be sent to you electronically.  As this is a virtual visit, video technology does not allow for your provider to perform a traditional examination. This may limit your provider's ability to fully assess your condition. If your provider identifies any concerns that need to be evaluated in person or the need to arrange testing (such as labs, EKG, etc.), we will make arrangements to do so. Although advances in technology are sophisticated, we cannot ensure that it will always work on either your end or our end. If the connection with a video visit is poor, the visit may have to be switched to a telephone visit. With either a video or telephone visit, we are not always able to ensure that we have a secure connection.  By engaging in this virtual visit, you consent to the provision of healthcare and authorize for your insurance to be billed (if applicable) for the services provided during this visit. Depending on your insurance coverage, you may receive a charge related to this service.  I need to obtain your verbal consent now. Are you willing to proceed with your visit today? Brandon Reese has provided verbal consent on 02/01/2024 for a virtual visit (video or telephone). Bari Learn, FNP  Date: 02/01/2024 5:01 PM   Virtual Visit via Video Note   I, Bari Learn, connected with  Brandon Reese  ([id number], 1985-10-29) on 02/01/24 at  4:45 PM EDT by a video-enabled telemedicine application and verified that I am speaking with the correct person using two identifiers.  Location: Patient: Virtual Visit Location  Patient: Home Provider: Virtual Visit Location Provider: Home Office   I discussed the limitations of evaluation and management by telemedicine and the availability of in person appointments. The patient expressed understanding and agreed to proceed.    History of Present Illness: Brandon Reese is a 38 y.o. who identifies as a male who was assigned male at birth, and is being seen today for continued URI symptoms. He was seen on 01/30/24 and diagnosed with URI and given flonase and zyrtec. States he is feeling better today, but was not able to go to work yesterday. States he continues to have fatigue. Home COVID was negative.   HPI: URI  This is a new problem. The current episode started in the past 7 days. The problem has been unchanged. Maximum temperature: low grade yesterday. Associated symptoms include congestion, headaches, sneezing (improved) and a sore throat. Pertinent negatives include no coughing, ear pain, joint pain, rhinorrhea or sinus pain. He has tried acetaminophen  (BC powder) for the symptoms. The treatment provided mild relief.    Problems: There are no active problems to display for this patient.   Allergies: No Known Allergies Medications:  Current Outpatient Medications:    cetirizine (ZYRTEC ALLERGY) 10 MG tablet, Take 1 tablet (10 mg total) by mouth daily., Disp: 90 tablet, Rfl: 1   fluticasone (FLONASE) 50 MCG/ACT nasal spray, Place 2 sprays into both nostrils daily., Disp: 16 g, Rfl: 6  Observations/Objective: Patient is well-developed, well-nourished in no acute distress.  Resting comfortably  at home.  Head is normocephalic,  atraumatic.  No labored breathing.  Speech is clear and coherent with logical content.  Patient is alert and oriented at baseline.  Nasal congestion  Assessment and Plan: 1. Viral URI (Primary)  Continue flonase and zyrtec  - Take meds as prescribed - Use a cool mist humidifier  -Use saline nose sprays frequently -Force  fluids -For any cough or congestion  Use plain Mucinex - regular strength or max strength is fine -For fever or aces or pains- take tylenol  or ibuprofen. -Throat lozenges if help Work note given  -Follow up if symptoms worsen or do not improve   Follow Up Instructions: I discussed the assessment and treatment plan with the patient. The patient was provided an opportunity to ask questions and all were answered. The patient agreed with the plan and demonstrated an understanding of the instructions.  A copy of instructions were sent to the patient via MyChart unless otherwise noted below.     The patient was advised to call back or seek an in-person evaluation if the symptoms worsen or if the condition fails to improve as anticipated.    Bari Learn, FNP

## 2024-02-01 NOTE — Patient Instructions (Signed)

## 2024-02-14 ENCOUNTER — Telehealth

## 2024-02-14 DIAGNOSIS — J208 Acute bronchitis due to other specified organisms: Secondary | ICD-10-CM

## 2024-02-14 DIAGNOSIS — B9689 Other specified bacterial agents as the cause of diseases classified elsewhere: Secondary | ICD-10-CM

## 2024-02-14 MED ORDER — BENZONATATE 100 MG PO CAPS: 100.0000 mg | ORAL_CAPSULE | Freq: Three times a day (TID) | ORAL | 0 refills | Status: DC | PRN

## 2024-02-14 MED ORDER — AZITHROMYCIN 250 MG PO TABS: ORAL_TABLET | ORAL | 0 refills | Status: AC

## 2024-02-14 NOTE — Progress Notes (Signed)
 We are sorry that you are not feeling well.  Here is how we plan to help!  Based on your presentation I believe you most likely have A cough due to bacteria.  When patients have a productive cough with a change in color or increased sputum production, we are concerned about bacterial bronchitis.  If left untreated it can progress to pneumonia.  If your symptoms do not improve with your treatment plan it is important that you contact your provider.   I have prescribed Azithromyin 250 mg: two tablets now and then one tablet daily for 4 additonal days    In addition you may use A prescription cough medication called Tessalon Perles 100mg . You may take 1-2 capsules every 8 hours as needed for your cough.  From your responses in the eVisit questionnaire you describe inflammation in the upper respiratory tract which is causing a significant cough.  This is commonly called Bronchitis and has four common causes:   Allergies Viral Infections Acid Reflux Bacterial Infection Allergies, viruses and acid reflux are treated by controlling symptoms or eliminating the cause. An example might be a cough caused by taking certain blood pressure medications. You stop the cough by changing the medication. Another example might be a cough caused by acid reflux. Controlling the reflux helps control the cough.  USE OF BRONCHODILATOR (RESCUE) INHALERS: There is a risk from using your bronchodilator too frequently.  The risk is that over-reliance on a medication which only relaxes the muscles surrounding the breathing tubes can reduce the effectiveness of medications prescribed to reduce swelling and congestion of the tubes themselves.  Although you feel brief relief from the bronchodilator inhaler, your asthma may actually be worsening with the tubes becoming more swollen and filled with mucus.  This can delay other crucial treatments, such as oral steroid medications. If you need to use a bronchodilator inhaler daily,  several times per day, you should discuss this with your provider.  There are probably better treatments that could be used to keep your asthma under control.     HOME CARE Only take medications as instructed by your medical team. Complete the entire course of an antibiotic. Drink plenty of fluids and get plenty of rest. Avoid close contacts especially the very young and the elderly Cover your mouth if you cough or cough into your sleeve. Always remember to wash your hands A steam or ultrasonic humidifier can help congestion.   GET HELP RIGHT AWAY IF: You develop worsening fever. You become short of breath You cough up blood. Your symptoms persist after you have completed your treatment plan MAKE SURE YOU  Understand these instructions. Will watch your condition. Will get help right away if you are not doing well or get worse.  Your e-visit answers were reviewed by a board certified advanced clinical practitioner to complete your personal care plan.  Depending on the condition, your plan could have included both over the counter or prescription medications. If there is a problem please reply  once you have received a response from your provider. Your safety is important to us .  If you have drug allergies check your prescription carefully.    You can use MyChart to ask questions about today's visit, request a non-urgent call back, or ask for a work or school excuse for 24 hours related to this e-Visit. If it has been greater than 24 hours you will need to follow up with your provider, or enter a new e-Visit to address those concerns.  You will get an e-mail in the next two days asking about your experience.  I hope that your e-visit has been valuable and will speed your recovery. Thank you for using e-visits.   I have spent 5 minutes in review of e-visit questionnaire, review and updating patient chart, medical decision making and response to patient.   Elsie Velma Lunger,  PA-C

## 2024-04-10 ENCOUNTER — Telehealth: Admitting: Family Medicine

## 2024-04-10 ENCOUNTER — Telehealth: Admitting: Physician Assistant

## 2024-04-10 DIAGNOSIS — J069 Acute upper respiratory infection, unspecified: Secondary | ICD-10-CM

## 2024-04-10 DIAGNOSIS — B349 Viral infection, unspecified: Secondary | ICD-10-CM

## 2024-04-10 MED ORDER — IPRATROPIUM BROMIDE 0.03 % NA SOLN: 2.0000 | Freq: Two times a day (BID) | NASAL | 0 refills | Status: AC

## 2024-04-10 MED ORDER — BENZONATATE 100 MG PO CAPS: 100.0000 mg | ORAL_CAPSULE | Freq: Three times a day (TID) | ORAL | 0 refills | Status: AC | PRN

## 2024-04-10 MED ORDER — OSELTAMIVIR PHOSPHATE 75 MG PO CAPS: 75.0000 mg | ORAL_CAPSULE | Freq: Two times a day (BID) | ORAL | 0 refills | Status: AC

## 2024-04-10 NOTE — Progress Notes (Signed)

## 2024-04-10 NOTE — Patient Instructions (Signed)
  Evalene Brodie, thank you for joining Lovette Borg, PA-C for today's virtual visit.  While this provider is not your primary care provider (PCP), if your PCP is located in our provider database this encounter information will be shared with them immediately following your visit.   A [facility] MyChart account gives you access to today's visit and all your visits, tests, and labs performed at [facility]  click here if you don't have a [facility] MyChart account or go to [email or web address]  Consent: (Patient) Brandon Reese provided verbal consent for this virtual visit at the beginning of the encounter.  Current Medications:  Current Outpatient Medications:    oseltamivir  (TAMIFLU ) 75 MG capsule, Take 1 capsule (75 mg total) by mouth 2 (two) times daily for 5 days., Disp: 10 capsule, Rfl: 0   benzonatate  (TESSALON ) 100 MG capsule, Take 1 capsule (100 mg total) by mouth 3 (three) times daily as needed for cough., Disp: 30 capsule, Rfl: 0   cetirizine  (ZYRTEC  ALLERGY) 10 MG tablet, Take 1 tablet (10 mg total) by mouth daily., Disp: 90 tablet, Rfl: 1   fluticasone  (FLONASE ) 50 MCG/ACT nasal spray, Place 2 sprays into both nostrils daily., Disp: 16 g, Rfl: 6   ipratropium (ATROVENT ) 0.03 % nasal spray, Place 2 sprays into both nostrils every 12 (twelve) hours., Disp: 30 mL, Rfl: 0   Medications ordered in this encounter:  Meds ordered this encounter  Medications   oseltamivir  (TAMIFLU ) 75 MG capsule    Sig: Take 1 capsule (75 mg total) by mouth 2 (two) times daily for 5 days.    Dispense:  10 capsule    Refill:  0    Supervising Provider:   BLAISE ALEENE KIDD [[id number]]     *If you need refills on other medications prior to your next appointment, please contact your pharmacy*  Follow-Up: Call back or seek an in-person evaluation if the symptoms worsen or if the condition fails to improve as anticipated.  [facility] [phone]  Other  Instructions Increase fluids Take otc Tylenol  for onset of fever. Take otc Imodium for diarrhea. Start medicine as prescribed. Continue to watch for worsening symptoms. Schedule a virtual appointment or follow up at an urgent care clinic if symptoms don't improve.    If you have been instructed to have an in-person evaluation today at a local Urgent Care facility, please use the link below. It will take you to a list of all of our available [facility], including address, phone number and hours of operation. Please do not delay care.  [facility]  If you or a family member do not have a primary care provider, use the link below to schedule a visit and establish care. When you choose a [facility] primary care physician or advanced practice provider, you gain a long-term partner in health. Find a Primary Care Provider  Learn more about [facility]'s in-office and virtual care options: [facility] - Get Care Now

## 2024-04-10 NOTE — Progress Notes (Signed)
 Virtual Visit Consent   Devlyn Retter, you are scheduled for a virtual visit with a [facility] provider today. Just as with appointments in the office, your consent must be obtained to participate. Your consent will be active for this visit and any virtual visit you may have with one of our providers in the next 365 days. If you have a MyChart account, a copy of this consent can be sent to you electronically.  As this is a virtual visit, video technology does not allow for your provider to perform a traditional examination. This may limit your provider's ability to fully assess your condition. If your provider identifies any concerns that need to be evaluated in person or the need to arrange testing (such as labs, EKG, etc.), we will make arrangements to do so. Although advances in technology are sophisticated, we cannot ensure that it will always work on either your end or our end. If the connection with a video visit is poor, the visit may have to be switched to a telephone visit. With either a video or telephone visit, we are not always able to ensure that we have a secure connection.  By engaging in this virtual visit, you consent to the provision of healthcare and authorize for your insurance to be billed (if applicable) for the services provided during this visit. Depending on your insurance coverage, you may receive a charge related to this service.  I need to obtain your verbal consent now. Are you willing to proceed with your visit today? Vinton Layson has provided verbal consent on 04/10/2024 for a virtual visit (video or telephone). Lovette Borg, [place]  Date: 04/10/2024 6:47 PM   Virtual Visit via Video Note   I, Shaun Runyon, connected with  Macen Joslin  ([id number], 01-09-1986) on 04/10/24 at  6:30 PM EST by a video-enabled telemedicine application and verified that I am speaking with the correct person using two identifiers.  Location: Patient: Virtual Visit Location Patient:  Home Provider: Virtual Visit Location Provider: Home Office   I discussed the limitations of evaluation and management by telemedicine and the availability of in person appointments. The patient expressed understanding and agreed to proceed.    History of Present Illness: Ames Hoban is a 38 y.o. who identifies as a male who was assigned male at birth, and is being seen today for URI symptoms including head congestion.  HPI: 38 y/o M presents via telehealth for c/o poor appetite, malaise, diarrhea, fatigue, head congestion x 2 days. +exposure to a viral illness via his girlfriend's daughter. Has not taken any otc medicine yet.     Problems: There are no active problems to display for this patient.   Allergies: No Known Allergies Medications:  Current Outpatient Medications:    oseltamivir  (TAMIFLU ) 75 MG capsule, Take 1 capsule (75 mg total) by mouth 2 (two) times daily for 5 days., Disp: 10 capsule, Rfl: 0   benzonatate  (TESSALON ) 100 MG capsule, Take 1 capsule (100 mg total) by mouth 3 (three) times daily as needed for cough., Disp: 30 capsule, Rfl: 0   cetirizine  (ZYRTEC  ALLERGY) 10 MG tablet, Take 1 tablet (10 mg total) by mouth daily., Disp: 90 tablet, Rfl: 1   fluticasone  (FLONASE ) 50 MCG/ACT nasal spray, Place 2 sprays into both nostrils daily., Disp: 16 g, Rfl: 6   ipratropium (ATROVENT ) 0.03 % nasal spray, Place 2 sprays into both nostrils every 12 (twelve) hours., Disp: 30 mL, Rfl: 0  Observations/Objective: Patient is well-developed, well-nourished in no acute  distress.  Resting comfortably  at home.  Head is normocephalic, atraumatic.  No labored breathing.  Speech is clear and coherent with logical content.  Patient is alert and oriented at baseline.    Assessment and Plan: 1. Viral illness (Primary) - oseltamivir  (TAMIFLU ) 75 MG capsule; Take 1 capsule (75 mg total) by mouth 2 (two) times daily for 5 days.  Dispense: 10 capsule; Refill: 0  Increase fluids Take  otc Tylenol  for onset of fever. Take otc Imodium for diarrhea. Start medicine as prescribed. Continue to watch for worsening symptoms. Schedule a virtual appointment or follow up at an urgent care clinic if symptoms don't improve.  Pt verbalized understanding and in agreement.    Follow Up Instructions: I discussed the assessment and treatment plan with the patient. The patient was provided an opportunity to ask questions and all were answered. The patient agreed with the plan and demonstrated an understanding of the instructions.  A copy of instructions were sent to the patient via MyChart unless otherwise noted below.   Patient has requested to receive PHI (AVS, Work Notes, etc) pertaining to this video visit through e-mail as they are currently without active MyChart. They have voiced understand that email is not considered secure and their health information could be viewed by someone other than the patient.   The patient was advised to call back or seek an in-person evaluation if the symptoms worsen or if the condition fails to improve as anticipated.    Loy Mccartt, PA-C
